# Patient Record
Sex: Female | Born: 1985 | Race: White | Hispanic: No | Marital: Married | Smoking: Never smoker
Health system: Southern US, Community
[De-identification: ages and names within clinical notes are randomized; demographics above are authoritative.]

---

## 2017-03-26 ENCOUNTER — Ambulatory Visit (INDEPENDENT_AMBULATORY_CARE_PROVIDER_SITE_OTHER): Payer: 59 | Admitting: Family Medicine

## 2017-03-26 ENCOUNTER — Ambulatory Visit (INDEPENDENT_AMBULATORY_CARE_PROVIDER_SITE_OTHER)
Admission: RE | Admit: 2017-03-26 | Discharge: 2017-03-26 | Disposition: A | Discharge status: Home / Self Care | Payer: 59 | Source: Ambulatory Visit | Attending: Family Medicine | Admitting: Family Medicine

## 2017-03-26 ENCOUNTER — Other Ambulatory Visit (INDEPENDENT_AMBULATORY_CARE_PROVIDER_SITE_OTHER): Payer: 59

## 2017-03-26 VITALS — BP 110/70 | HR 95 | Ht 69.0 in | Wt 173.0 lb

## 2017-03-26 DIAGNOSIS — M545 Low back pain, unspecified: Secondary | ICD-10-CM

## 2017-03-26 DIAGNOSIS — G8929 Other chronic pain: Secondary | ICD-10-CM | POA: Diagnosis not present

## 2017-03-26 DIAGNOSIS — M2550 Pain in unspecified joint: Secondary | ICD-10-CM

## 2017-03-26 LAB — CBC WITH DIFFERENTIAL/PLATELET
BASOS PCT: 1.2 % (ref 0.0–3.0)
Basophils Absolute: 0.1 10*3/uL (ref 0.0–0.1)
EOS PCT: 2.9 % (ref 0.0–5.0)
Eosinophils Absolute: 0.2 10*3/uL (ref 0.0–0.7)
HCT: 38.2 % (ref 36.0–46.0)
HEMOGLOBIN: 12.8 g/dL (ref 12.0–15.0)
LYMPHS ABS: 2.3 10*3/uL (ref 0.7–4.0)
Lymphocytes Relative: 30.3 % (ref 12.0–46.0)
MCHC: 33.6 g/dL (ref 30.0–36.0)
MCV: 90 fl (ref 78.0–100.0)
MONO ABS: 0.6 10*3/uL (ref 0.1–1.0)
MONOS PCT: 7.7 % (ref 3.0–12.0)
Neutro Abs: 4.3 10*3/uL (ref 1.4–7.7)
Neutrophils Relative %: 57.9 % (ref 43.0–77.0)
Platelets: 301 10*3/uL (ref 150.0–400.0)
RBC: 4.24 Mil/uL (ref 3.87–5.11)
RDW: 12.7 % (ref 11.5–15.5)
WBC: 7.5 10*3/uL (ref 4.0–10.5)

## 2017-03-26 LAB — COMPREHENSIVE METABOLIC PANEL
ALK PHOS: 56 U/L (ref 39–117)
ALT: 11 U/L (ref 0–35)
AST: 12 U/L (ref 0–37)
Albumin: 4.3 g/dL (ref 3.5–5.2)
BUN: 15 mg/dL (ref 6–23)
CHLORIDE: 106 meq/L (ref 96–112)
CO2: 29 mEq/L (ref 19–32)
Calcium: 9.7 mg/dL (ref 8.4–10.5)
Creatinine, Ser: 0.66 mg/dL (ref 0.40–1.20)
GFR: 109.27 mL/min (ref >60.00)
GLUCOSE: 112 mg/dL — AB (ref 70–99)
POTASSIUM: 4 meq/L (ref 3.5–5.1)
SODIUM: 141 meq/L (ref 135–145)
Total Bilirubin: 0.3 mg/dL (ref 0.2–1.2)
Total Protein: 7 g/dL (ref 6.0–8.3)

## 2017-03-26 LAB — IBC PANEL
IRON: 75 ug/dL (ref 42–145)
Saturation Ratios: 23.1 % (ref 20.0–50.0)
TRANSFERRIN: 232 mg/dL (ref 212.0–360.0)

## 2017-03-26 LAB — URIC ACID: Uric Acid, Serum: 5 mg/dL (ref 2.4–7.0)

## 2017-03-26 LAB — C-REACTIVE PROTEIN: CRP: 0.1 mg/dL — AB (ref 0.5–20.0)

## 2017-03-26 LAB — VITAMIN D 25 HYDROXY (VIT D DEFICIENCY, FRACTURES): VITD: 24.64 ng/mL — ABNORMAL LOW (ref 30.00–100.00)

## 2017-03-26 LAB — SEDIMENTATION RATE: SED RATE: 8 mm/h (ref 0–20)

## 2017-03-26 LAB — TSH: TSH: 0.9 u[IU]/mL (ref 0.35–4.50)

## 2017-03-26 MED ORDER — PREDNISONE 50 MG PO TABS: 50.0000 mg | ORAL_TABLET | Freq: Every day | ORAL | 0 refills | Status: AC

## 2017-03-26 MED ORDER — VITAMIN D (ERGOCALCIFEROL) 1.25 MG (50000 UNIT) PO CAPS: 50000.0000 [IU] | ORAL_CAPSULE | ORAL | 0 refills | Status: AC

## 2017-03-26 MED ORDER — GABAPENTIN 100 MG PO CAPS: 200.0000 mg | ORAL_CAPSULE | Freq: Every day | ORAL | 3 refills | Status: AC

## 2017-03-26 NOTE — Progress Notes (Signed)
Tawana ScaleZach Govani Radloff D.O. [facility] [address] [place], [place] [address] Phone: [phone] Subjective:     CC: Back pain  BJY:[id number]:Subjective  Anne Chapman is a 31 y.o. female coming in with complaint of back pain. She had a back injury 8 years ago and her back hasn't ever been the same. She has had intermittent pain but it has switched to constant pain. She drives a distance and that typically has stiffness with traveling. She also complains of sciatic nerve pain especially on the right. Stretching seems to alleviate her pain somewhat. No history of surgery on her back. 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 Standing in one place seems to be the worst for patient.        No past medical history on file.  Patient Active Problem List   Diagnosis Date Noted  . Low back pain 03/26/2017     Social History   Socioeconomic History  . Marital status: Married    Spouse name: Not on file  . Number of children: Not on file  . Years of education: Not on file  . Highest education level: Not on file  Social Needs  . Financial resource strain: Not on file  . Food insecurity - worry: Not on file  . Food insecurity - inability: Not on file  . Transportation needs - medical: Not on file  . Transportation needs - non-medical: Not on file  Occupational History  . Not on file  Tobacco Use  . Smoking status: Not on file  Substance and Sexual Activity  . Alcohol use: Not on file  . Drug use: Not on file  . Sexual activity: Not on file  Other Topics Concern  . Not on file  Social History Narrative  . Not on file   Not on File no known drug allergies No family history on file.  No family history of autoimmune disease   Past medical history, social, surgical and family history all reviewed in electronic medical record.  No pertanent information unless stated regarding to the chief complaint.   Review of Systems:Review of systems updated and as accurate as  of 03/26/17  No headache, visual changes, nausea, vomiting, diarrhea, constipation, dizziness, abdominal pain, skin rash, fevers, chills, night sweats, weight loss, swollen lymph nodes, body aches, joint swelling, muscle aches, chest pain, shortness of breath, mood changes.   Objective  Blood pressure 110/70, pulse 95, height 5\' 9"  (1.753 m), weight 173 lb (78.5 kg), SpO2 98 %. Systems examined below as of 03/26/17   General: No apparent distress alert and oriented x3 mood and affect normal, dressed appropriately.  HEENT: Pupils equal, extraocular movements intact  Respiratory: Patient's speak in full sentences and does not appear short of breath  Cardiovascular: No lower extremity edema, non tender, no erythema  Skin: Warm dry intact with no signs of infection or rash on extremities or on axial skeleton.  Abdomen: Soft nontender  Neuro: Cranial nerves II through XII are intact, neurovascularly intact in all extremities with 2+ DTRs and 2+ pulses.  Lymph: No lymphadenopathy of posterior or anterior cervical chain or axillae bilaterally.  Gait normal with good balance and coordination.  MSK:  Non tender with full range of motion and good stability and symmetric strength and tone of shoulders, elbows, wrist, hip, knee and ankles bilaterally.  Back Exam:  Inspection: Significant tightness of the paraspinal musculature bilaterally Motion: Flexion 25 deg, Extension 5 deg, Side Bending  to 35 deg bilaterally,  Rotation to 30 deg bilaterally  SLR laying: Negative significant tightness though. XSLR laying: Negative  Palpable tenderness: Moderate tenderness to palpation. FABER: negative. Sensory change: Gross sensation intact to all lumbar and sacral dermatomes.  Reflexes: 2+ at both patellar tendons, 2+ at achilles tendons, Babinski's downgoing.  Strength at foot  Plantar-flexion: 5/5 Dorsi-flexion: 5/5 Eversion: 5/5 Inversion: 5/5  Leg strength  Quad: 5/5 Hamstring: 5/5 Hip flexor: 5/5 Hip  abductors: 5/5  Gait unremarkable.   Impression and Recommendations:     This case required medical decision making of moderate complexity.      Note: This dictation was prepared with Dragon dictation along with smaller phrase technology. Any transcriptional errors that result from this process are unintentional.

## 2017-03-26 NOTE — Patient Instructions (Addendum)
Good to see you.  Ice 20 minutes 2 times daily. Usually after activity and before bed. Exercises 3 times a week.  Xrays and labs downstairs Over the counter get iron 65mg  with 500mg  of vitamin C daily  New meds Prednisone daily for 5 days Gabapentin 200mg  at night Once weekly vitamin D for 12 weeks See em again in 10-14 days and lets see how you are doing.

## 2017-03-26 NOTE — Assessment & Plan Note (Signed)
Lower back pain.  Significant stiffness.  Patient has decreased range of motion in almost all planes.  Patient does not have any radicular symptoms at this moment but is having difficulty with sleep.  Differential includes something that would potentially cause significant stiffness like ankylosing spondylitis with patient's age.  We discussed iron deficiency and vitamin D deficiency that could be contributing.  Other medications.  Patient given prednisone.  Warned of potential side effects.  Home exercises given.  Patient will come back and see me again in 1-2 weeks

## 2017-03-31 LAB — HLA-B27 ANTIGEN: HLA-B27 ANTIGEN: NEGATIVE

## 2017-03-31 LAB — PTH, INTACT AND CALCIUM
CALCIUM: 9.8 mg/dL (ref 8.6–10.2)
PTH: 18 pg/mL (ref 14–64)

## 2017-03-31 LAB — RHEUMATOID FACTOR

## 2017-03-31 LAB — ANGIOTENSIN CONVERTING ENZYME: Angiotensin-Converting Enzyme: 28 U/L (ref 9–67)

## 2017-03-31 LAB — ANA: ANA: NEGATIVE

## 2017-03-31 LAB — CALCIUM, IONIZED: Calcium, Ion: 5.5 mg/dL (ref 4.8–5.6)

## 2017-04-03 ENCOUNTER — Encounter: Payer: Self-pay | Admitting: Family Medicine

## 2017-04-08 NOTE — Progress Notes (Signed)
Anne Chapman D.O. [facility] [address] [place], [place] [address] Phone: [phone] Subjective:    I'm seeing this patient by the request  of:    CC: Low back pain follow-up  Anne Chapman:Subjective  Anne Chapman is a 31 y.o. female coming in with complaint of low back pain.  Found to have significant tightness.  Was concern for even a potential autoimmune disease.  Patient did get labs that were unremarkable and these were independently visualized by me.  Patient was to start some conservative therapy including icing regimen, oral anti-inflammatories, and home exercises.  Patient states her neck has been hurting since the last visit. She says it feels like tightness.  Patient states that unfortunately she continues to have the aching sensation does not feel with lower back has made significant progress at this time     Patient did have x-rays taken March 27, 2017.  These were independently visualized by me showing the patient does have moderate degenerative disc disease at L4 through S1.  History reviewed. No pertinent past medical history. History reviewed. No pertinent surgical history. Social History   Socioeconomic History  . Marital status: Married    Spouse name: None  . Number of children: None  . Years of education: None  . Highest education level: None  Social Needs  . Financial resource strain: None  . Food insecurity - worry: None  . Food insecurity - inability: None  . Transportation needs - medical: None  . Transportation needs - non-medical: None  Occupational History  . None  Tobacco Use  . Smoking status: Never Smoker  . Smokeless tobacco: Never Used  Substance and Sexual Activity  . Alcohol use: None  . Drug use: None  . Sexual activity: None  Other Topics Concern  . None  Social History Narrative  . None   Not on File History reviewed. No pertinent family history.  No family history of autoimmune   Past medical history, social, surgical  and family history all reviewed in electronic medical record.  No pertanent information unless stated regarding to the chief complaint.   Review of Systems:Review of systems updated and as accurate as of 04/09/17  No headache, visual changes, nausea, vomiting, diarrhea, constipation, dizziness, abdominal pain, skin rash, fevers, chills, night sweats, weight loss, swollen lymph nodes, body aches, joint swelling, muscle aches, chest pain, shortness of breath, mood changes.   Objective  Blood pressure 130/82, pulse 94, height 5\' 9"  (1.753 m), weight 174 lb (78.9 kg), SpO2 99 %. Systems examined below as of 04/09/17   General: No apparent distress alert and oriented x3 mood and affect normal, dressed appropriately.  HEENT: Pupils equal, extraocular movements intact  Respiratory: Patient's speak in full sentences and does not appear short of breath  Cardiovascular: No lower extremity edema, non tender, no erythema  Skin: Warm dry intact with no signs of infection or rash on extremities or on axial skeleton.  Abdomen: Soft nontender  Neuro: Cranial nerves II through XII are intact, neurovascularly intact in all extremities with 2+ DTRs and 2+ pulses.  Lymph: No lymphadenopathy of posterior or anterior cervical chain or axillae bilaterally.  Gait normal with good balance and coordination.  MSK:  Non tender with full range of motion and good stability and symmetric strength and tone of shoulders, elbows, wrist, hip, knee and ankles bilaterally.  Back Exam:  Inspection: Significant loss of lordosis Motion: Flexion 25 deg, Extension 15 deg, Side Bending to 35 deg bilaterally,  Rotation to 35 deg bilaterally  SLR laying: Positive bilateral XSLR laying: Negative  Palpable tenderness: Moderate to severe tenderness to palpation of the L3-L4. FABER: Tightness bilaterally. Sensory change: Gross sensation intact to all lumbar and sacral dermatomes.  Reflexes: 2+ at both patellar tendons, 2+ at achilles  tendons, Babinski's downgoing.  Strength at foot  Plantar-flexion: 5/5 Dorsi-flexion: 5/5 Eversion: 5/5 Inversion: 5/5  Leg strength  Quad: 5/5 Hamstring: 5/5 Hip flexor: 5/5 Hip abductors: 5/5  Gait unremarkable.  Osteopathic findings C2 flexed rotated and side bent right C4 flexed rotated and side bent left C6 flexed rotated and side bent left T3 extended rotated and side bent right inhaled third rib T9 extended rotated and side bent left L2 flexed rotated and side bent right L4 flexed rotated side bend left Sacrum right on right  Impression and Recommendations:     This case required medical decision making of moderate complexity.      Note: This dictation was prepared with Dragon dictation along with smaller phrase technology. Any transcriptional errors that result from this process are unintentional.

## 2017-04-09 ENCOUNTER — Ambulatory Visit (INDEPENDENT_AMBULATORY_CARE_PROVIDER_SITE_OTHER): Payer: 59 | Admitting: Family Medicine

## 2017-04-09 ENCOUNTER — Encounter: Payer: Self-pay | Admitting: Family Medicine

## 2017-04-09 VITALS — BP 130/82 | HR 94 | Ht 69.0 in | Wt 174.0 lb

## 2017-04-09 DIAGNOSIS — M5416 Radiculopathy, lumbar region: Secondary | ICD-10-CM | POA: Diagnosis not present

## 2017-04-09 DIAGNOSIS — M999 Biomechanical lesion, unspecified: Secondary | ICD-10-CM

## 2017-04-09 NOTE — Patient Instructions (Signed)
Good to see you  Duexis 3 times a day for 3 days Keep up with the exercises.  Ice is your friend Turmeric 500mg  daily  We will get MRI and make sure nothing else is going on For manipulation see me again in 3 weeks

## 2017-04-09 NOTE — Assessment & Plan Note (Signed)
Decision today to treat with OMT was based on Physical Exam  After verbal consent patient was treated with HVLA, ME, FPR techniques in , thoracic, lumbar and sacral areas  Patient tolerated the procedure well with improvement in symptoms  Patient given exercises, stretches and lifestyle modifications  See medications in patient instructions if given  Patient will follow up in 4 weeks 

## 2017-04-09 NOTE — Assessment & Plan Note (Signed)
Patient has a new symptoms with flexion of the back.  Patient was found to have moderate to severe radicular symptoms in the posterior aspect of the leg.  Attempted osteopathic manipulation with very minimal improvement.  Due to patient's tightness as well as the radicular symptoms that do feel that patient does need advanced imaging at this time.  Patient has very minimal range of motion in flexion of the back that is considerably decreased compared to other peers.  Patient knows if any worsening symptoms such as radicular symptoms would not cause bowel or bladder incontinence or significant weakness patient is to seek medical attention immediately.  We need to rule out such things as spinal stenosis that would be congenital, tethered cord, or occult fractures.  Patient will follow-up after advanced imaging will discuss further

## 2017-04-21 ENCOUNTER — Encounter: Payer: Self-pay | Admitting: Family Medicine

## 2017-05-01 ENCOUNTER — Ambulatory Visit: Payer: 59 | Admitting: Family Medicine

## 2018-12-07 IMAGING — DX DG LUMBAR SPINE COMPLETE 4+V
5 series · 5 of 5 positions shown · non-contrast
Comparison: None.

CLINICAL DATA: Worsening chronic low back pain for 2 years, with
radiation to both legs.

EXAM:
LUMBAR SPINE - COMPLETE 4+ VIEW

[l-spine ap]
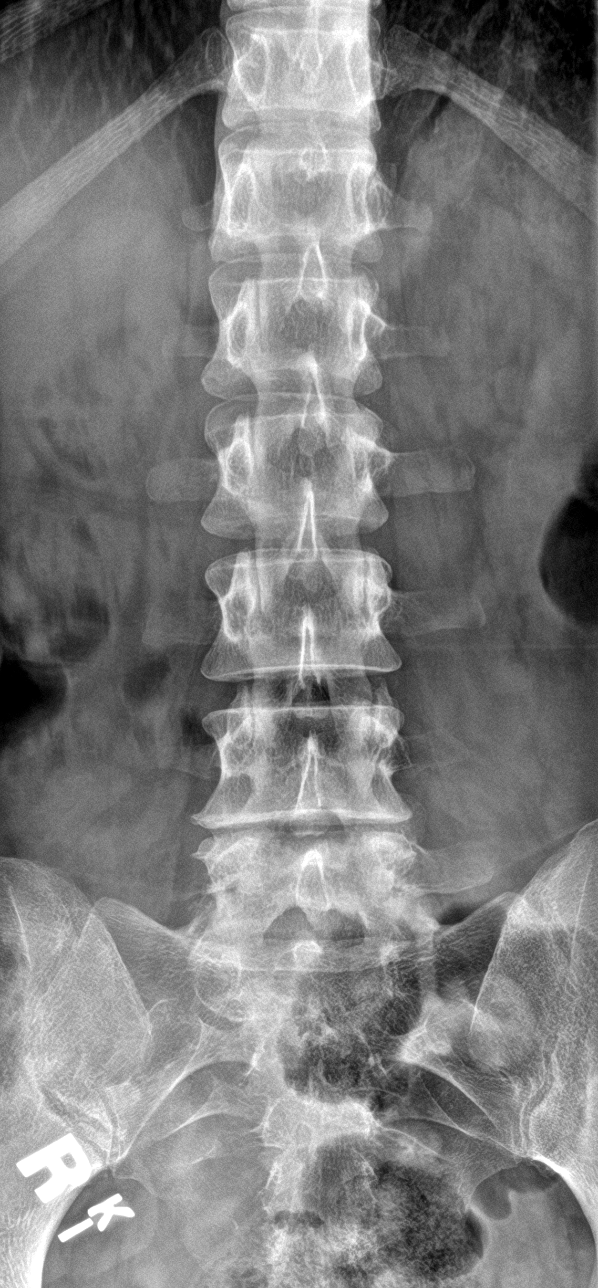

[l-spine obl (1 of 2)]
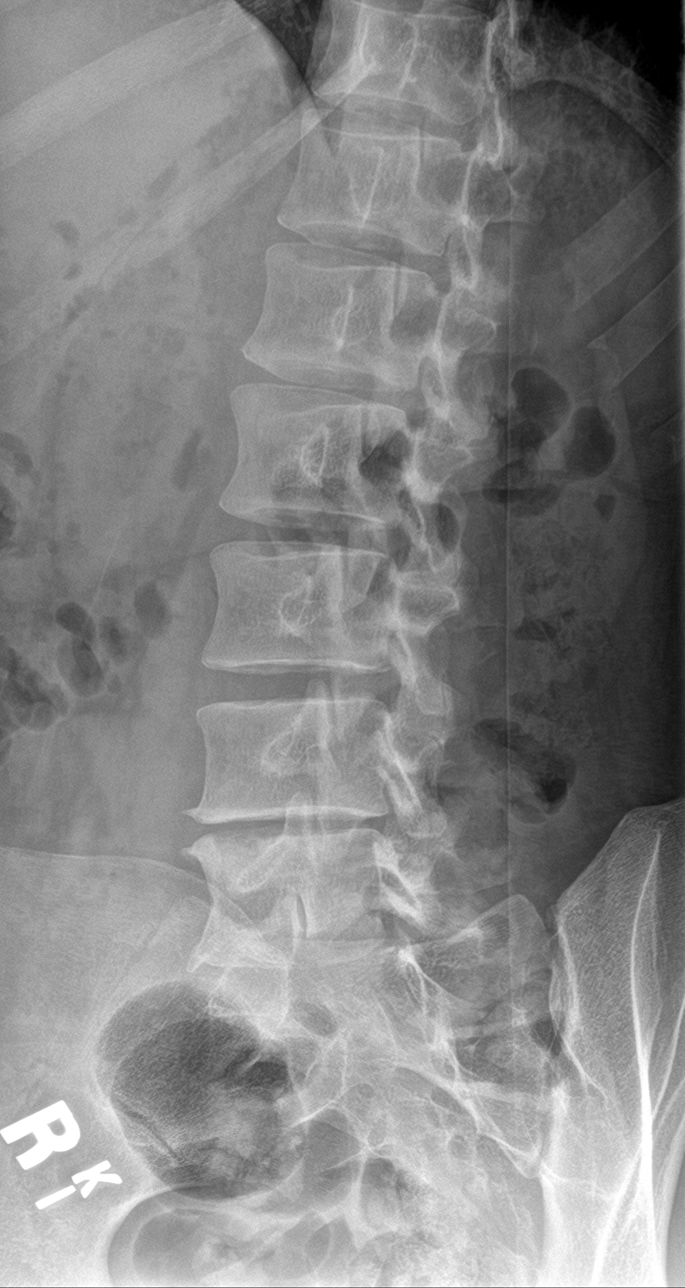

[l-spine obl (2 of 2)]
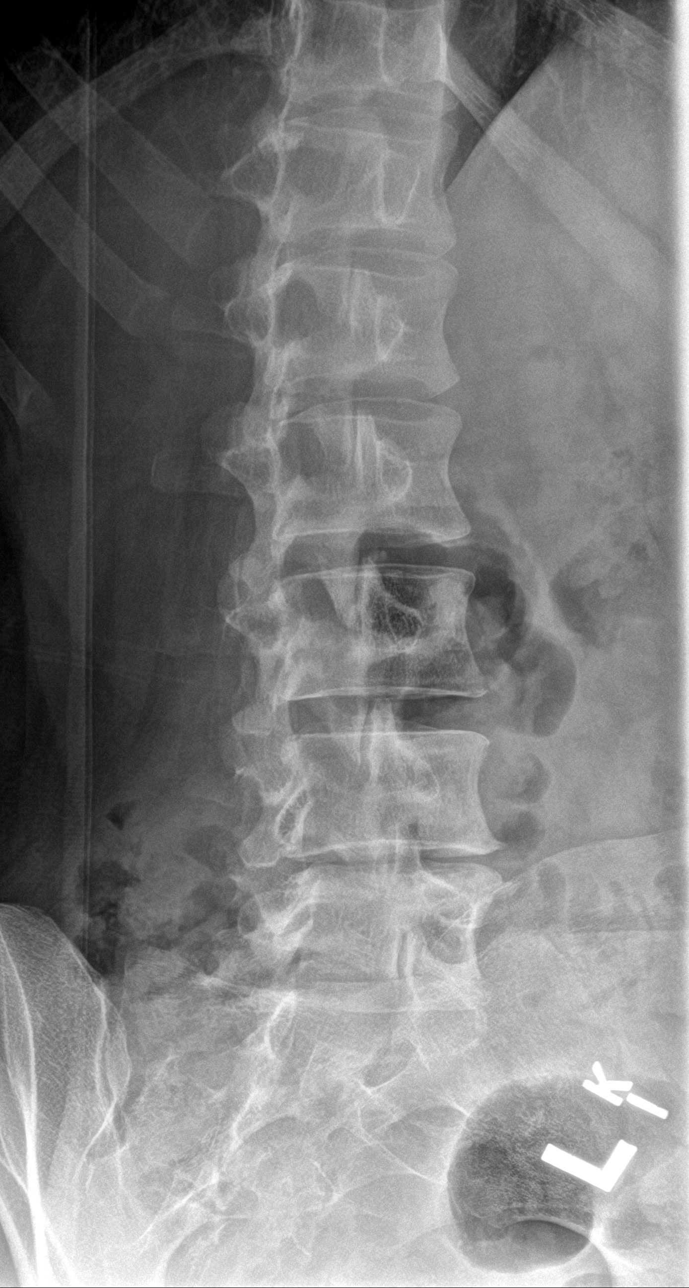

[l-spine lat]
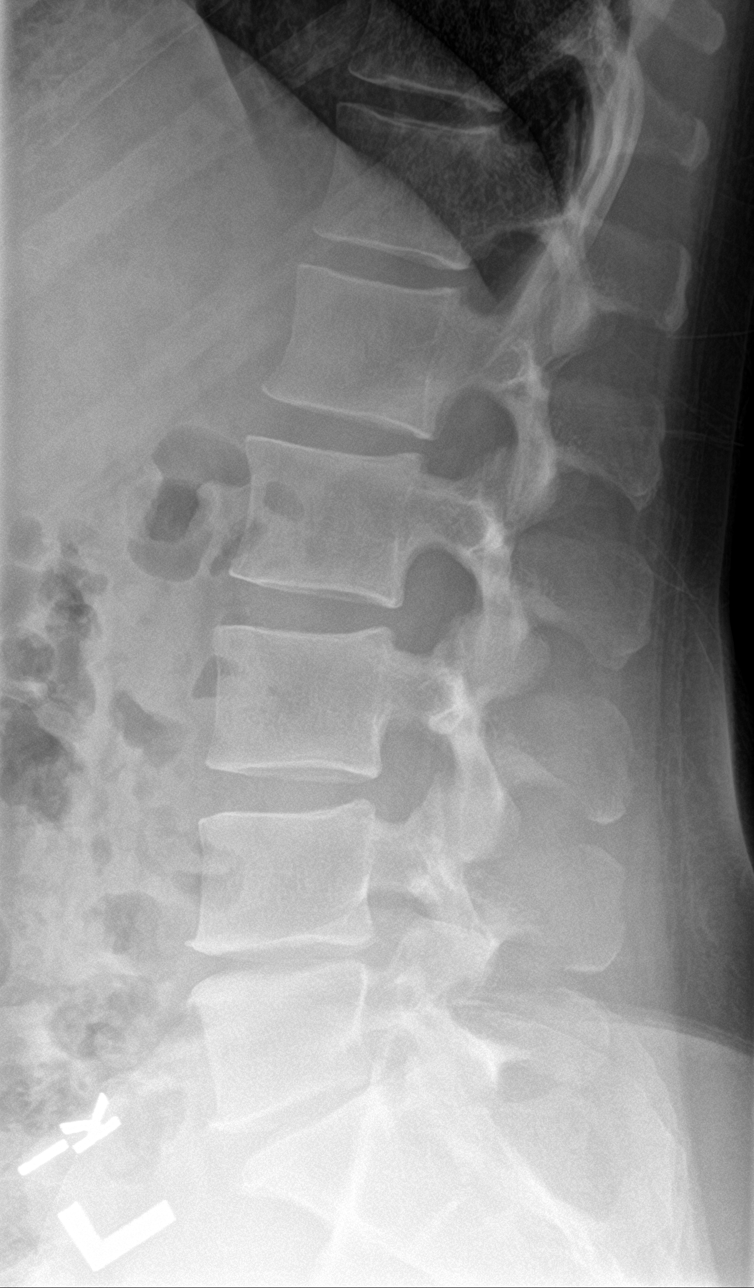

[l-spine spot]
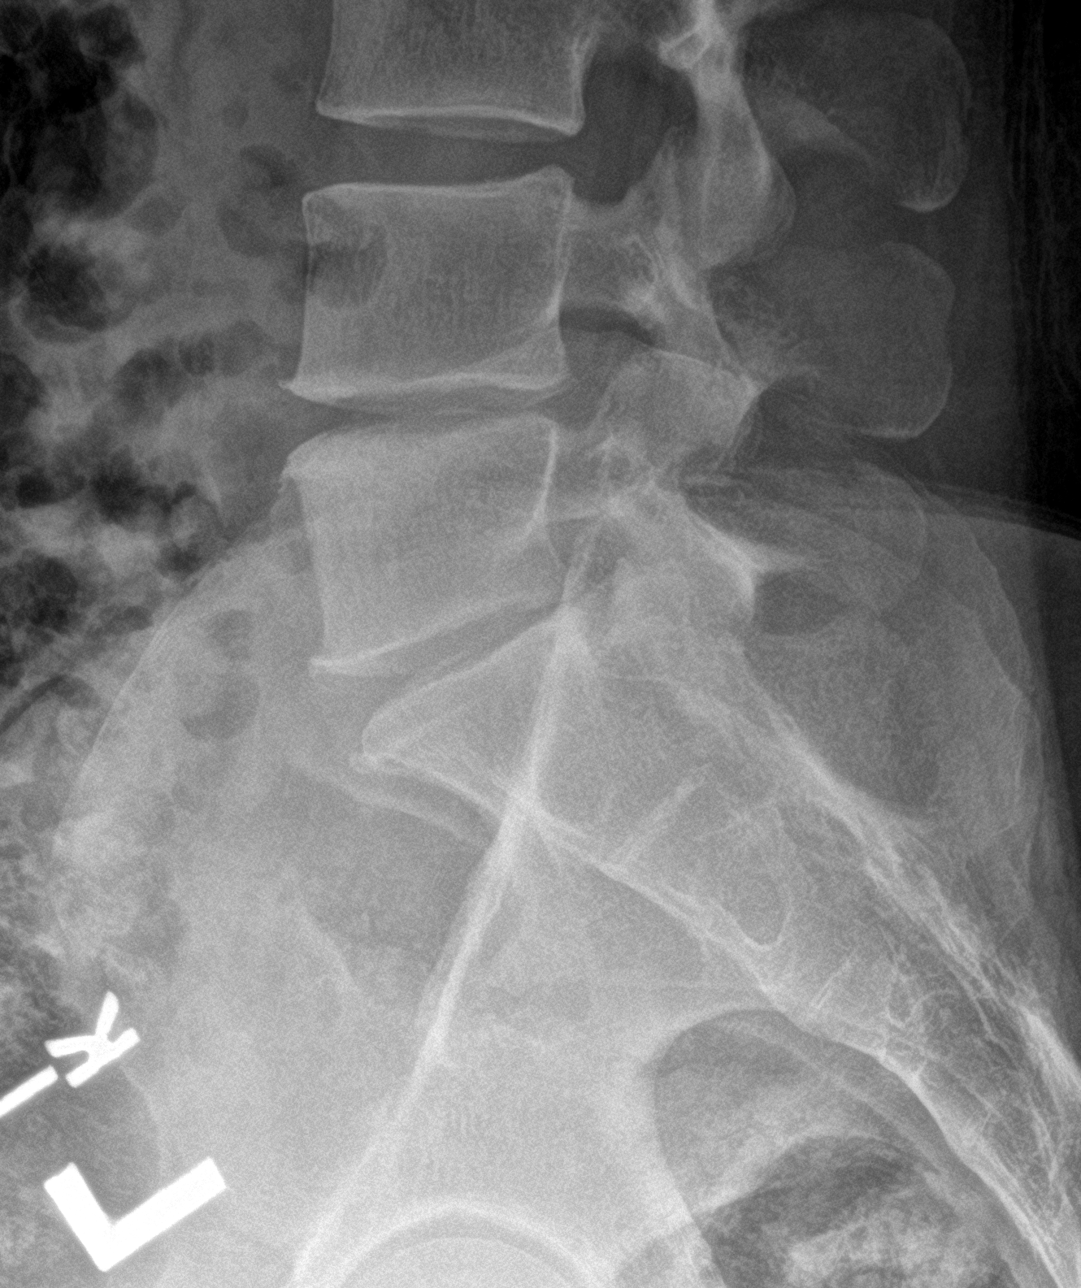

[5 of 5 positions shown; findings below may reference images not displayed]

FINDINGS: There is no evidence of lumbar spine fracture. Alignment is normal.
Mild to moderate degenerative disc disease is seen at L4-5 and
L5-S1. No significant facet arthropathy or other osseous
abnormality.
IMPRESSION: No acute findings. Mild to moderate lower lumbar degenerative disc
disease.
